# Patient Record
Sex: Female | Born: 1980 | Hispanic: Yes | Marital: Single
Health system: Southern US, Community
[De-identification: ages and names within clinical notes are randomized; demographics above are authoritative.]

---

## 2017-08-23 ENCOUNTER — Emergency Department (HOSPITAL_COMMUNITY): Payer: No Typology Code available for payment source

## 2017-08-23 ENCOUNTER — Encounter (HOSPITAL_COMMUNITY): Payer: Self-pay | Admitting: Emergency Medicine

## 2017-08-23 ENCOUNTER — Emergency Department (HOSPITAL_COMMUNITY)
Admission: EM | Admit: 2017-08-23 | Discharge: 2017-08-23 | Disposition: A | Discharge status: Home / Self Care | Payer: No Typology Code available for payment source | Attending: Emergency Medicine | Admitting: Emergency Medicine

## 2017-08-23 DIAGNOSIS — S0990XA Unspecified injury of head, initial encounter: Secondary | ICD-10-CM | POA: Diagnosis present

## 2017-08-23 DIAGNOSIS — Y9389 Activity, other specified: Secondary | ICD-10-CM | POA: Insufficient documentation

## 2017-08-23 DIAGNOSIS — Y999 Unspecified external cause status: Secondary | ICD-10-CM | POA: Insufficient documentation

## 2017-08-23 DIAGNOSIS — R109 Unspecified abdominal pain: Secondary | ICD-10-CM | POA: Insufficient documentation

## 2017-08-23 DIAGNOSIS — S8001XA Contusion of right knee, initial encounter: Secondary | ICD-10-CM | POA: Diagnosis not present

## 2017-08-23 DIAGNOSIS — Y92410 Unspecified street and highway as the place of occurrence of the external cause: Secondary | ICD-10-CM | POA: Diagnosis not present

## 2017-08-23 DIAGNOSIS — T07XXXA Unspecified multiple injuries, initial encounter: Secondary | ICD-10-CM

## 2017-08-23 DIAGNOSIS — R079 Chest pain, unspecified: Secondary | ICD-10-CM | POA: Insufficient documentation

## 2017-08-23 DIAGNOSIS — M25511 Pain in right shoulder: Secondary | ICD-10-CM | POA: Insufficient documentation

## 2017-08-23 DIAGNOSIS — S0081XA Abrasion of other part of head, initial encounter: Secondary | ICD-10-CM | POA: Diagnosis not present

## 2017-08-23 DIAGNOSIS — Z23 Encounter for immunization: Secondary | ICD-10-CM | POA: Diagnosis not present

## 2017-08-23 LAB — CBC WITH DIFFERENTIAL/PLATELET
BASOS PCT: 0 %
Basophils Absolute: 0 10*3/uL (ref 0.0–0.1)
EOS PCT: 2 %
Eosinophils Absolute: 0.4 10*3/uL (ref 0.0–0.7)
HCT: 37.1 % (ref 36.0–46.0)
HEMOGLOBIN: 12.4 g/dL (ref 12.0–15.0)
LYMPHS ABS: 2.6 10*3/uL (ref 0.7–4.0)
Lymphocytes Relative: 17 %
MCH: 29.2 pg (ref 26.0–34.0)
MCHC: 33.4 g/dL (ref 30.0–36.0)
MCV: 87.5 fL (ref 78.0–100.0)
MONOS PCT: 3 %
Monocytes Absolute: 0.5 10*3/uL (ref 0.1–1.0)
NEUTROS PCT: 78 %
Neutro Abs: 11.7 10*3/uL — ABNORMAL HIGH (ref 1.7–7.7)
PLATELETS: 300 10*3/uL (ref 150–400)
RBC: 4.24 MIL/uL (ref 3.87–5.11)
RDW: 12.3 % (ref 11.5–15.5)
WBC: 15.2 10*3/uL — ABNORMAL HIGH (ref 4.0–10.5)

## 2017-08-23 LAB — COMPREHENSIVE METABOLIC PANEL
ALT: 27 U/L (ref 14–54)
ANION GAP: 10 (ref 5–15)
AST: 30 U/L (ref 15–41)
Albumin: 4 g/dL (ref 3.5–5.0)
Alkaline Phosphatase: 64 U/L (ref 38–126)
BILIRUBIN TOTAL: 0.7 mg/dL (ref 0.3–1.2)
BUN: 9 mg/dL (ref 6–20)
CHLORIDE: 102 mmol/L (ref 101–111)
CO2: 25 mmol/L (ref 22–32)
Calcium: 8.8 mg/dL — ABNORMAL LOW (ref 8.9–10.3)
Creatinine, Ser: 0.71 mg/dL (ref 0.44–1.00)
Glucose, Bld: 122 mg/dL — ABNORMAL HIGH (ref 65–99)
POTASSIUM: 3 mmol/L — AB (ref 3.5–5.1)
Sodium: 137 mmol/L (ref 135–145)
TOTAL PROTEIN: 7.2 g/dL (ref 6.5–8.1)

## 2017-08-23 LAB — SAMPLE TO BLOOD BANK

## 2017-08-23 LAB — I-STAT BETA HCG BLOOD, ED (MC, WL, AP ONLY)

## 2017-08-23 MED ORDER — POTASSIUM CHLORIDE CRYS ER 20 MEQ PO TBCR
40.0000 meq | EXTENDED_RELEASE_TABLET | Freq: Once | ORAL | Status: AC
  Administered 2017-08-23: 40 meq via ORAL
  Filled 2017-08-23: qty 2

## 2017-08-23 MED ORDER — CEPHALEXIN 500 MG PO CAPS: 500.0000 mg | ORAL_CAPSULE | Freq: Four times a day (QID) | ORAL | 0 refills | Status: AC

## 2017-08-23 MED ORDER — BACITRACIN ZINC 500 UNIT/GM EX OINT
TOPICAL_OINTMENT | Freq: Two times a day (BID) | CUTANEOUS | Status: DC
  Administered 2017-08-23: 12:00:00 via TOPICAL

## 2017-08-23 MED ORDER — FLUORESCEIN SODIUM 1 MG OP STRP
1.0000 | ORAL_STRIP | Freq: Once | OPHTHALMIC | Status: AC
  Administered 2017-08-23: 1 via OPHTHALMIC

## 2017-08-23 MED ORDER — IOPAMIDOL (ISOVUE-300) INJECTION 61%
INTRAVENOUS | Status: AC
  Administered 2017-08-23: 100 mL
  Filled 2017-08-23: qty 100

## 2017-08-23 MED ORDER — POTASSIUM CHLORIDE ER 10 MEQ PO TBCR: 10.0000 meq | EXTENDED_RELEASE_TABLET | Freq: Every day | ORAL | 0 refills | Status: AC

## 2017-08-23 MED ORDER — SODIUM CHLORIDE 0.9 % IV BOLUS (SEPSIS)
1000.0000 mL | Freq: Once | INTRAVENOUS | Status: AC
  Administered 2017-08-23: 1000 mL via INTRAVENOUS

## 2017-08-23 MED ORDER — FENTANYL CITRATE (PF) 100 MCG/2ML IJ SOLN
50.0000 ug | Freq: Once | INTRAMUSCULAR | Status: AC
  Administered 2017-08-23: 50 ug via INTRAVENOUS
  Filled 2017-08-23: qty 2

## 2017-08-23 MED ORDER — LIDOCAINE HCL (PF) 1 % IJ SOLN
30.0000 mL | Freq: Once | INTRAMUSCULAR | Status: AC
  Administered 2017-08-23: 30 mL
  Filled 2017-08-23: qty 30

## 2017-08-23 MED ORDER — TETANUS-DIPHTH-ACELL PERTUSSIS 5-2.5-18.5 LF-MCG/0.5 IM SUSP
0.5000 mL | Freq: Once | INTRAMUSCULAR | Status: AC
  Administered 2017-08-23: 0.5 mL via INTRAMUSCULAR
  Filled 2017-08-23: qty 0.5

## 2017-08-23 MED ORDER — HYDROCODONE-ACETAMINOPHEN 5-325 MG PO TABS
1.0000 | ORAL_TABLET | Freq: Once | ORAL | Status: AC
  Administered 2017-08-23: 1 via ORAL
  Filled 2017-08-23: qty 1

## 2017-08-23 MED ORDER — HYDROCODONE-ACETAMINOPHEN 5-325 MG PO TABS: 2.0000 | ORAL_TABLET | ORAL | 0 refills | Status: AC | PRN

## 2017-08-23 MED ORDER — ONDANSETRON HCL 4 MG/2ML IJ SOLN
4.0000 mg | Freq: Once | INTRAMUSCULAR | Status: AC
  Administered 2017-08-23: 4 mg via INTRAVENOUS
  Filled 2017-08-23: qty 2
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
## 2017-08-23 NOTE — ED Notes (Signed)
Patient transported to CT 
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
